# Patient Record
Sex: Male | Born: 1995 | Race: White | Hispanic: Yes | Marital: Single | Smoking: Never smoker
Health system: Southern US, Community
[De-identification: ages and names within clinical notes are randomized; demographics above are authoritative.]

## PROBLEM LIST (undated history)

## (undated) DIAGNOSIS — J45909 Unspecified asthma, uncomplicated: Secondary | ICD-10-CM

---
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
## 2017-03-18 ENCOUNTER — Telehealth: Payer: Self-pay | Admitting: Pediatrics

## 2017-03-18 ENCOUNTER — Encounter: Payer: Self-pay | Admitting: Pediatrics

## 2017-03-18 NOTE — Telephone Encounter (Signed)
Pt called stating that he is trying to join the [employer] and they asked him to please provide them with his last pe. ( 06/13/2016 ) Per Lars MageJuan, he is in need of this no later than Monday , 03/23/2017. I did advised the pt that it normally takes up to 7 business days. Please call him when ready.

## 2017-03-20 NOTE — Telephone Encounter (Signed)
Spoke with [place] regarding medical records. He only needs a copy of his last physical. He does not need any forms completed. Progress notes taken to front along with immunization records. Will more than likely need ROI completed.

## 2017-03-31 ENCOUNTER — Telehealth: Payer: Self-pay | Admitting: Pediatrics

## 2017-03-31 NOTE — Telephone Encounter (Signed)
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

## 2017-04-10 ENCOUNTER — Encounter: Payer: Self-pay | Admitting: Pediatrics

## 2017-04-10 ENCOUNTER — Other Ambulatory Visit: Payer: Self-pay | Admitting: Pediatrics

## 2017-04-10 DIAGNOSIS — R079 Chest pain, unspecified: Secondary | ICD-10-CM

## 2017-04-10 NOTE — Telephone Encounter (Addendum)
Letter done; copy for patient and faxing. Patient is aware.  Will also enter cardiology referral to trigger recall for the follow up.

## 2017-05-24 IMAGING — CT CT ABDOMEN W/ CM
2 of 4 series · 17 of 46 positions shown, 19 images · IV contrast (APPLIED)
Comparison: 07/31/2013.

CLINICAL DATA: Red area at in belly button for 2 weeks, pain.
Persistent umbilical sinus. Fever. Diarrhea.

EXAM:
CT ABDOMEN WITH CONTRAST
TECHNIQUE: Multidetector CT imaging of the abdomen was performed using the
standard protocol following bolus administration of intravenous
contrast.
CONTRAST:  125mL [id number] IOPAMIDOL ([id number]) INJECTION 61%

[Series 2: cor abd · coronal · 0.66mm/px · 3 of 92 slices shown]
[im 31/92  soft-tissue]
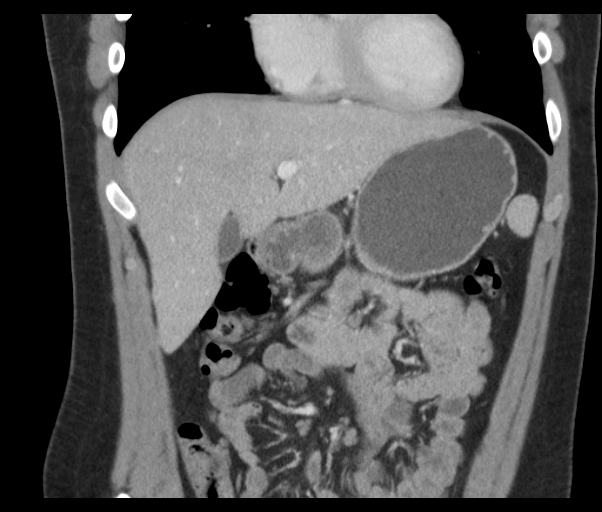
[im 41/92  soft-tissue]
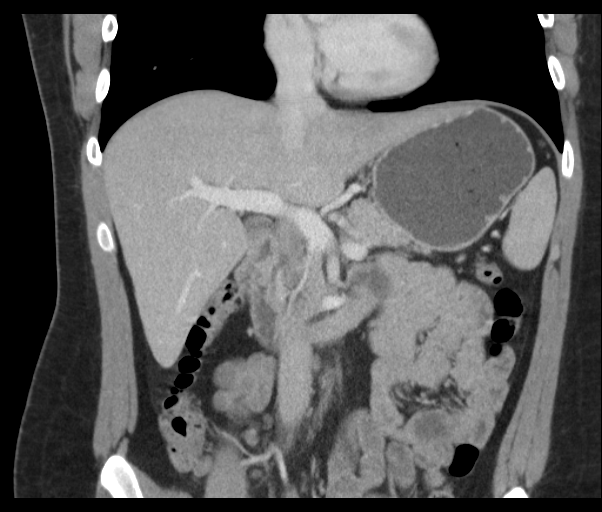
[im 51/92  soft-tissue]
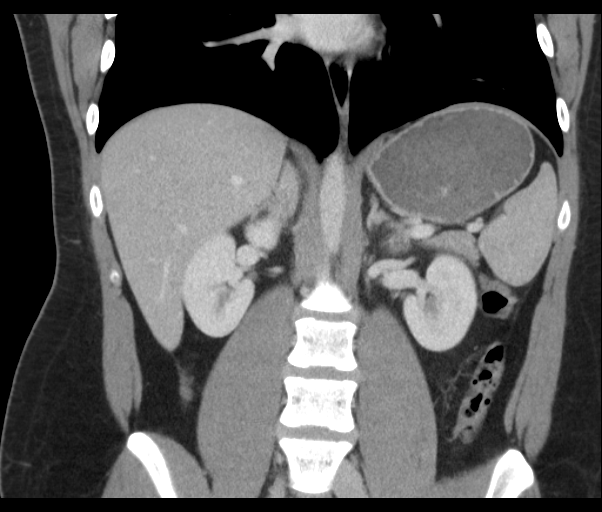

[Series 5: abdroutine 5.0 i31s 1 · axial · 0.79mm/px · z∈[-401,-116]mm · 14 of 64 slices shown, 16 images]
[im 4/64  soft-tissue]
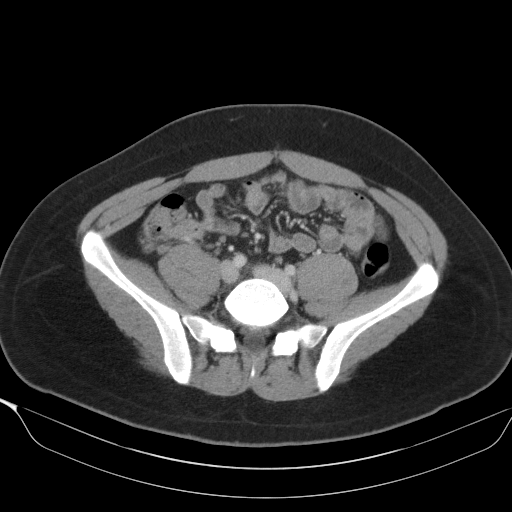
[im 4/64  bone]
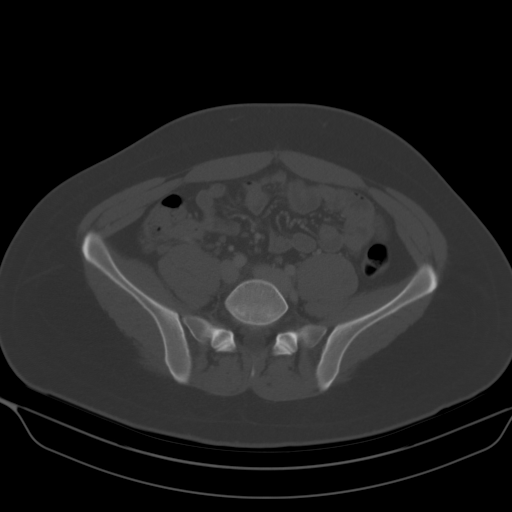
[im 10/64  soft-tissue]
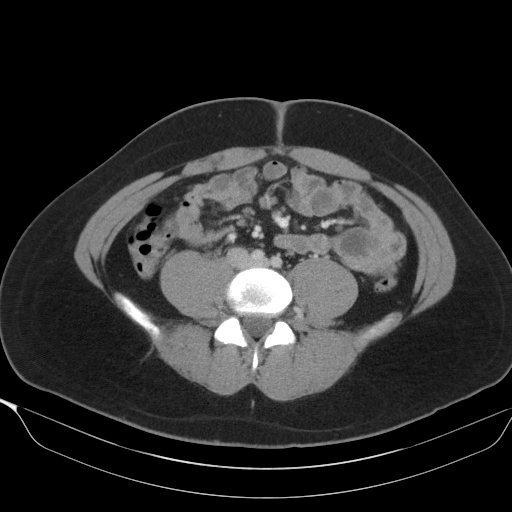
[im 13/64  soft-tissue]
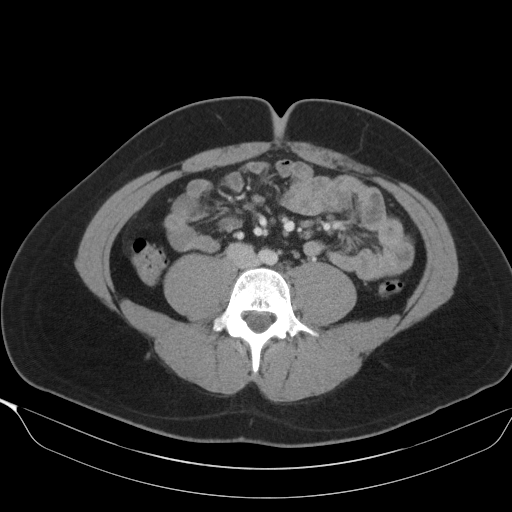
[im 19/64  soft-tissue]
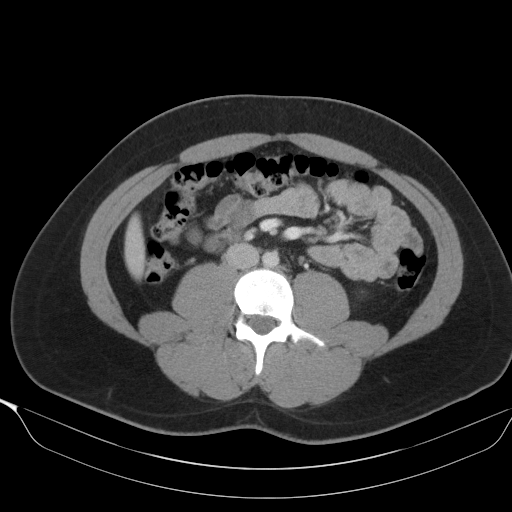
[im 22/64  soft-tissue]
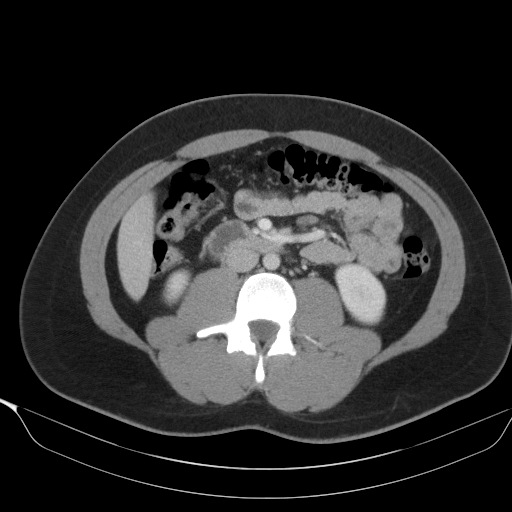
[im 25/64  soft-tissue]
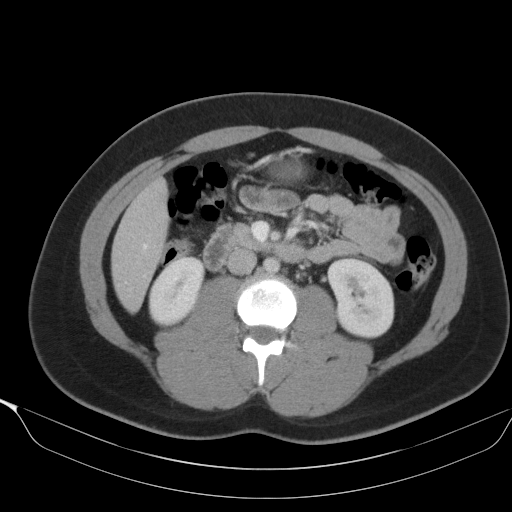
[im 31/64  soft-tissue]
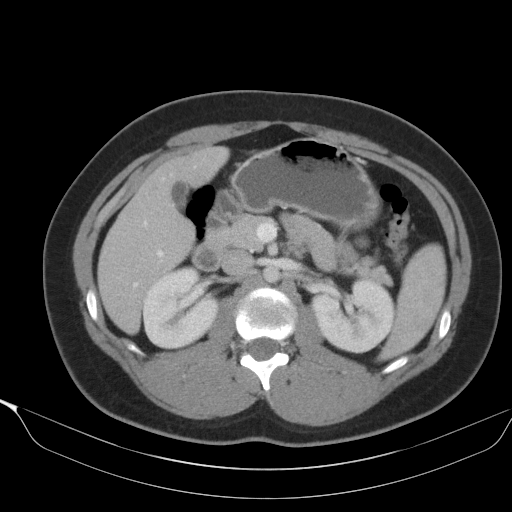
[im 34/64  soft-tissue]
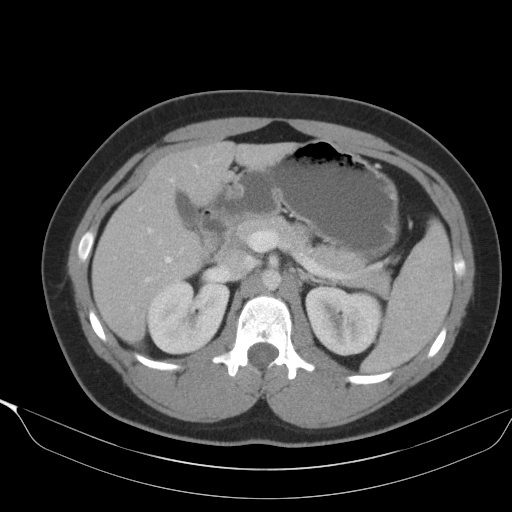
[im 40/64  soft-tissue]
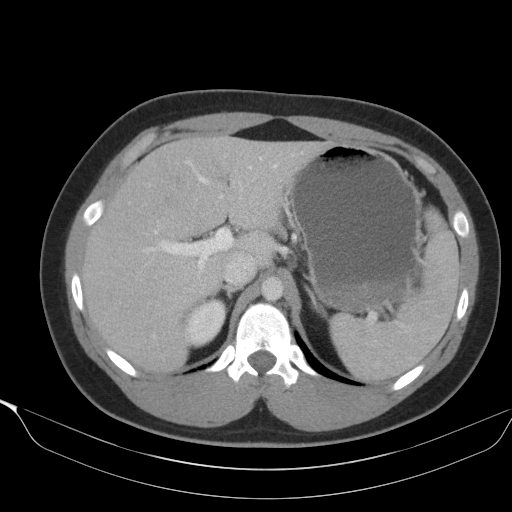
[im 40/64  bone]
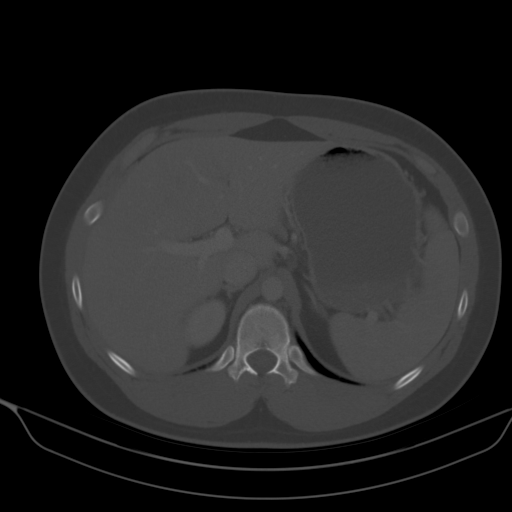
[im 43/64  soft-tissue]
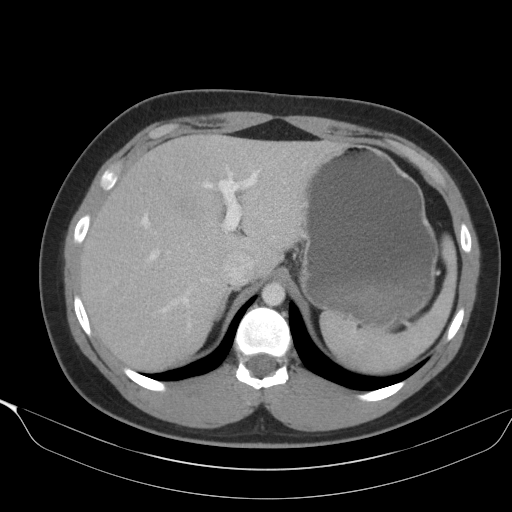
[im 49/64  soft-tissue]
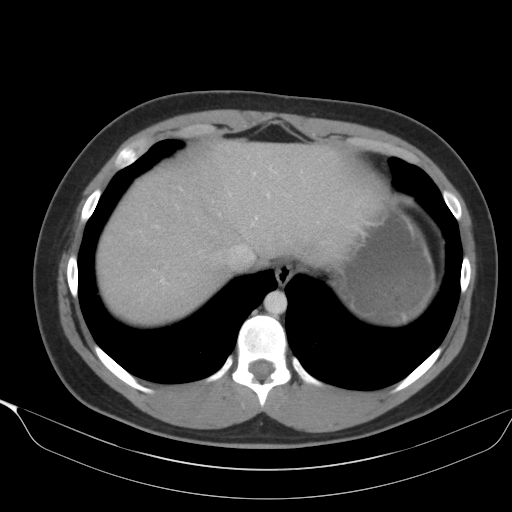
[im 52/64  soft-tissue]
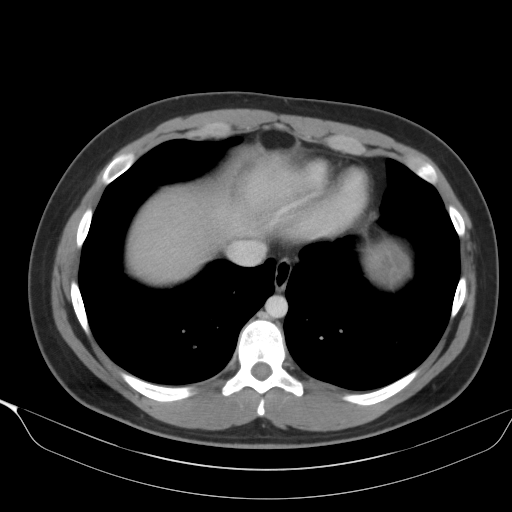
[im 55/64  soft-tissue]
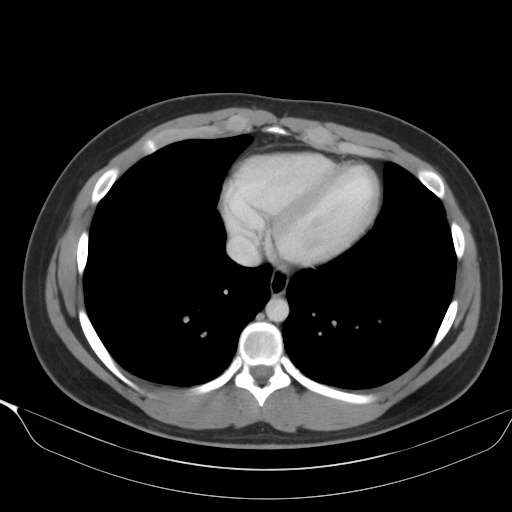
[im 61/64  soft-tissue]
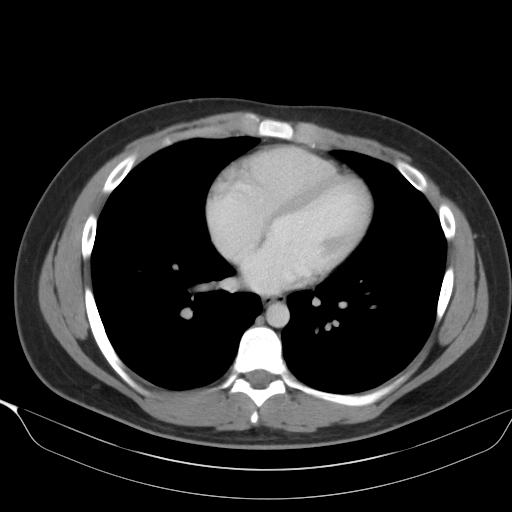

[17 of 46 positions shown; findings below may reference images not displayed]

FINDINGS: Lower chest: Lung bases show no acute findings. Heart size normal.
No pericardial or pleural effusion.

Hepatobiliary: Liver and gallbladder are unremarkable. No biliary
ductal dilatation.

Pancreas: Negative.

Spleen: Splenule, negative.

Adrenals/Urinary Tract: Adrenal glands and kidneys are unremarkable.
Visualized portions of the ureters are decompressed.

Stomach/Bowel: Stomach and visualized portions of the small bowel
and colon are unremarkable.

Vascular/Lymphatic: Vascular structures are unremarkable. No
pathologically enlarged lymph nodes. Small bowel mesenteric lymph
nodes measure up to 8 mm, similar to the prior exam.

Other: The umbilicus and periumbilical region are unremarkable. No
focal skin thickening, soft tissue tract, fluid collection or mass.

Musculoskeletal: No worrisome lytic or sclerotic lesions.
IMPRESSION: No findings to explain the patient's given symptoms.

## 2019-11-08 ENCOUNTER — Encounter (HOSPITAL_COMMUNITY): Payer: Self-pay | Admitting: Emergency Medicine

## 2019-11-08 ENCOUNTER — Other Ambulatory Visit: Payer: Self-pay

## 2019-11-08 ENCOUNTER — Emergency Department (HOSPITAL_COMMUNITY)
Admission: EM | Admit: 2019-11-08 | Discharge: 2019-11-09 | Disposition: A | Discharge status: Home / Self Care | Attending: Emergency Medicine | Admitting: Emergency Medicine

## 2019-11-08 DIAGNOSIS — R21 Rash and other nonspecific skin eruption: Secondary | ICD-10-CM | POA: Diagnosis not present

## 2019-11-08 NOTE — ED Triage Notes (Signed)
Pt presents with blistering rash to right side of his face. Pt states it showed up last week after he tried to pop a zit. Denies pain or itchiness.

## 2019-11-09 MED ORDER — CEPHALEXIN 500 MG PO CAPS: 500.0000 mg | ORAL_CAPSULE | Freq: Two times a day (BID) | ORAL | 0 refills | Status: AC

## 2019-11-09 NOTE — Discharge Instructions (Addendum)
Take the antibiotics as prescribed./If this area starts enlarging you need to seek reevaluation.

## 2019-11-09 NOTE — ED Provider Notes (Signed)
[facility] EMERGENCY DEPARTMENT Provider Note   CSN: [id number] Arrival date & time: 11/08/19  1641     History Chief Complaint  Patient presents with  . Rash    Tyler Proctor is a 24 y.o. male with past medical history who presents for evaluation of rash.  Patient states he has had a rash located to the right side of his face x1 week.  Occurred after he tried to "pop as it.".  Rashes nontender.  Has occasional itching.  Is not draining.  States he has a single solitary lesion to mid central chest.  This lesion is mildly tender.  No recent lotions or perfumes.  Rates his current pain a 0/10.  Is here, chills, nausea, vomiting, chest pain, shortness of breath, recent tick bites, recent insect bites, mucous membrane involvement, rash to palms and soles, urinary complaints.  Denies aggravating relieving factors.  Has been trying a Hispanic "ointment."   History obtained from patient and past medical records.  No interpreter is used.  HPI     Past Medical History:  Diagnosis Date  . Asthma    resolved on it's own  . Umbilical granuloma     Patient Active Problem List   Diagnosis Date Noted  . Chest pain 11/18/2016  . Cellulitis, umbilical 37/90/2409  . Abscess, umbilical 73/53/2992  . Hemoptysis 04/26/2015  . BMI (body mass index), pediatric, greater than or equal to 95% for age 41/21/2015    Past Surgical History:  Procedure Laterality Date  . EXCISION UMBILICAL NODULE N/A 09/19/8339   Procedure: EXCISION OF UMBILICAL GRANULOMA;  Surgeon: Coralie Keens, MD;  Location: [facility];  Service: General;  Laterality: N/A;  . LAPAROSCOPIC APPENDECTOMY N/A 07/31/2013   Procedure: APPENDECTOMY LAPAROSCOPIC;  Surgeon: Jerilynn Mages. Gerald Stabs, MD;  Location: [facility];  Service: Pediatrics;  Laterality: N/A;       Family History  Problem Relation Age of Onset  . Obesity Sister   . Diabetes Paternal Grandmother   . Hypertension Paternal  Grandmother     Social History   Tobacco Use  . Smoking status: Never Smoker  . Smokeless tobacco: Never Used  Substance Use Topics  . Alcohol use: No  . Drug use: No    Home Medications Prior to Admission medications   Medication Sig Start Date End Date Taking? Authorizing Provider  cephALEXin (KEFLEX) 500 MG capsule Take 1 capsule (500 mg total) by mouth 2 (two) times daily for 7 days. 11/09/19 11/16/19  Mollye Guinta A, PA-C    Allergies    Patient has no known allergies.  Review of Systems   Review of Systems  Constitutional: Negative.   HENT: Negative.   Respiratory: Negative.   Cardiovascular: Negative.   Gastrointestinal: Negative.   Genitourinary: Negative.   Musculoskeletal: Negative.   Skin: Positive for rash.  Neurological: Negative.   All other systems reviewed and are negative.   Physical Exam Updated Vital Signs BP 131/74   Pulse 92   Temp 99.1 F (37.3 C) (Oral)   Resp 18   SpO2 98%   Physical Exam Vitals and nursing note reviewed.  Constitutional:      General: He is not in acute distress.    Appearance: He is well-developed. He is not ill-appearing, toxic-appearing or diaphoretic.  HENT:     Head: Normocephalic and atraumatic.     Jaw: There is normal jaw occlusion.     Right Ear: Tympanic membrane, ear canal and external ear normal.  Left Ear: Tympanic membrane, ear canal and external ear normal.     Ears:     Comments: No lesions to ear canal    Nose: Nose normal.     Mouth/Throat:     Lips: Pink.     Mouth: Mucous membranes are moist.     Pharynx: Oropharynx is clear. Uvula midline.     Comments: No mucosal lesions, edema.  Mucous membranes moist. Eyes:     Pupils: Pupils are equal, round, and reactive to light.  Cardiovascular:     Rate and Rhythm: Normal rate and regular rhythm.  Pulmonary:     Effort: Pulmonary effort is normal. No respiratory distress.     Breath sounds: Normal breath sounds and air entry.     Comments:  Speaks in full sentences without difficulty Abdominal:     General: There is no distension.     Palpations: Abdomen is soft.  Musculoskeletal:        General: Normal range of motion.     Cervical back: Full passive range of motion without pain, normal range of motion and neck supple.     Comments: Moves in all 4 extremities without difficulty.  Compartments soft.  Tactile temperature to extremities. No bony tenderness.  Skin:    General: Skin is warm and dry.     Capillary Refill: Capillary refill takes less than 2 seconds.     Comments: Picture in chart.  Slightly vesicular in nature rash to right zygomatic and over right mandible and V2 distribution.  No rash to ears.  He also has single solitary lesion mid central chest.  Nontender palpation.  No bleeding or drainage.  No fluctuance or induration.  No bulla, target lesions, desquamated skin.  Neurological:     General: No focal deficit present.     Mental Status: He is alert.     Cranial Nerves: Cranial nerves are intact.     Sensory: Sensation is intact.     Motor: Motor function is intact.     Coordination: Coordination is intact.     Gait: Gait is intact.     Comments: Cn 2-12 intact without difficulty        ED Results / Procedures / Treatments   Labs (all labs ordered are listed, but only abnormal results are displayed) Labs Reviewed - No data to display  EKG None  Radiology No results found.  Procedures Procedures (including critical care time)  Medications Ordered in ED Medications - No data to display  ED Course  I have reviewed the triage vital signs and the nursing notes.  Pertinent labs & imaging results that were available during my care of the patient were reviewed by me and considered in my medical decision making (see chart for details).  24 year old presents for evaluation of rash to right side of face.  Located in V2-V3 distribution however does not involve ears.  Nontender palpation.  No active  bleeding or drainage.  No evidence of mucosal involvement.  No recent insect bites, change in lotion, perfumes or food intake.  No evidence of allergic reaction, anaphylaxis.  No evidence of abscess or cellulitis.  Question zoster however would be atypical for patient to have lesion on his chest as well.  Possibly more impetigo.  Will treat with antibiotics and have him follow-up in 2 days for reevaluation.  Patient denies any difficulty breathing or swallowing.  Pt has a patent airway without stridor and is handling secretions without difficulty; no angioedema. No  blisters, no warmth, no draining sinus tracts, no superficial abscesses, no bullous impetigo, no desquamation, no target lesions with dusky purpura or a central bulla. Not tender to touch. No concern for SJS, TEN, TSS, tick borne illness, syphilis or other life-threatening condition.   The patient has been appropriately medically screened and/or stabilized in the ED. I have low suspicion for any other emergent medical condition which would require further screening, evaluation or treatment in the ED or require inpatient management.  Patient is hemodynamically stable and in no acute distress.  Patient able to ambulate in department prior to ED.  Evaluation does not show acute pathology that would require ongoing or additional emergent interventions while in the emergency department or further inpatient treatment.  I have discussed the diagnosis with the patient and answered all questions.  Pain is been managed while in the emergency department and patient has no further complaints prior to discharge.  Patient is comfortable with plan discussed in room and is stable for discharge at this time.  I have discussed strict return precautions for returning to the emergency department.  Patient was encouraged to follow-up with PCP/specialist refer to at discharge.   Patient discussed with attending, Dr. Bebe Shaggy who agrees with the treatment, plan and  disposition   MDM Rules/Calculators/A&P                           Final Clinical Impression(s) / ED Diagnoses Final diagnoses:  Rash    Rx / DC Orders ED Discharge Orders         Ordered    cephALEXin (KEFLEX) 500 MG capsule  2 times daily     Discontinue  Reprint     11/09/19 0129           Crystallynn Noorani A, PA-C 11/09/19 0130    Zadie Rhine, MD 11/09/19 [phone]

## 2021-07-10 ENCOUNTER — Encounter (HOSPITAL_COMMUNITY): Payer: Self-pay | Admitting: Emergency Medicine

## 2021-07-10 ENCOUNTER — Ambulatory Visit (HOSPITAL_COMMUNITY)
Admission: EM | Admit: 2021-07-10 | Discharge: 2021-07-10 | Disposition: A | Discharge status: Home / Self Care | Payer: Medicaid Other | Attending: Physician Assistant | Admitting: Physician Assistant

## 2021-07-10 ENCOUNTER — Other Ambulatory Visit: Payer: Self-pay

## 2021-07-10 DIAGNOSIS — H61893 Other specified disorders of external ear, bilateral: Secondary | ICD-10-CM | POA: Diagnosis not present

## 2021-07-10 DIAGNOSIS — H6123 Impacted cerumen, bilateral: Secondary | ICD-10-CM | POA: Diagnosis not present

## 2021-07-10 MED ORDER — OFLOXACIN 0.3 % OT SOLN: 3.0000 [drp] | Freq: Two times a day (BID) | OTIC | 0 refills | Status: AC

## 2021-07-10 NOTE — ED Provider Notes (Signed)
[facility]    CSN: [id number] Arrival date & time: 07/10/21  1615      History   Chief Complaint Chief Complaint  Patient presents with   Otalgia    HPI Tyler Proctor is a 26 y.o. male.   Patient presents today with a 2-week history of bilateral ear fullness.  He denies any recent illness or URI symptoms including cough, congestion, sore throat, sneezing, chest pain, shortness of breath, nausea, vomiting.  Denies any recent swimming or airplane travel.  Denies any otorrhea or significant otalgia.  He has tried peroxide without improvement of symptoms.  He denies history of allergies.  He does report a history of cerumen impaction and reports current symptoms are similar to previous episodes of this condition.  He does not use Q-tips but occasionally uses earbuds/earplugs.   Past Medical History:  Diagnosis Date   Asthma    resolved on it's own   Umbilical granuloma     Patient Active Problem List   Diagnosis Date Noted   Chest pain 11/18/2016   Cellulitis, umbilical XX123456   Abscess, umbilical XX123456   Hemoptysis 04/26/2015   BMI (body mass index), pediatric, greater than or equal to 95% for age 43/21/2015    Past Surgical History:  Procedure Laterality Date   EXCISION UMBILICAL NODULE N/A [id number]   Procedure: EXCISION OF UMBILICAL GRANULOMA;  Surgeon: Coralie Keens, MD;  Location: [facility];  Service: General;  Laterality: N/A;   LAPAROSCOPIC APPENDECTOMY N/A 07/31/2013   Procedure: APPENDECTOMY LAPAROSCOPIC;  Surgeon: Jerilynn Mages. Gerald Stabs, MD;  Location: [facility];  Service: Pediatrics;  Laterality: N/A;       Home Medications    Prior to Admission medications   Medication Sig Start Date End Date Taking? Authorizing Provider  ofloxacin (FLOXIN) 0.3 % OTIC solution Place 3 drops into both ears 2 (two) times daily for 5 days. 07/10/21 07/15/21 Yes Monifah Freehling, Derry Skill, PA-C    Family History Family History  Problem Relation  Age of Onset   Healthy Mother    Healthy Father    Obesity Sister    Diabetes Paternal Grandmother    Hypertension Paternal Grandmother     Social History Social History   Tobacco Use   Smoking status: Never   Smokeless tobacco: Never  Substance Use Topics   Alcohol use: No   Drug use: No     Allergies   Patient has no known allergies.   Review of Systems Review of Systems  Constitutional:  Negative for activity change, appetite change, fatigue and fever.  HENT:  Negative for congestion, ear pain (fullness), sinus pressure, sneezing and sore throat.   Respiratory:  Negative for cough and shortness of breath.   Cardiovascular:  Negative for chest pain.  Neurological:  Negative for dizziness, light-headedness and headaches.    Physical Exam Triage Vital Signs ED Triage Vitals  Enc Vitals Group     BP 07/10/21 1731 (!) 142/81     Pulse Rate 07/10/21 1731 72     Resp 07/10/21 1731 16     Temp 07/10/21 1731 98.3 F (36.8 C)     Temp Source 07/10/21 1731 Oral     SpO2 07/10/21 1731 100 %     Weight 07/10/21 1728 236 lb 15.9 oz (107.5 kg)     Height 07/10/21 1728 5\' 11"  (1.803 m)     Head Circumference --      Peak Flow --      Pain Score  07/10/21 1728 2     Pain Loc --      Pain Edu? --      Excl. in [place]? --    No data found.  Updated Vital Signs BP (!) 142/81 (BP Location: Right Arm)    Pulse 72    Temp 98.3 F (36.8 C) (Oral)    Resp 16    Ht 5\' 11"  (1.803 m)    Wt 236 lb 15.9 oz (107.5 kg)    SpO2 100%    BMI 33.05 kg/m   Visual Acuity Right Eye Distance:   Left Eye Distance:   Bilateral Distance:    Right Eye Near:   Left Eye Near:    Bilateral Near:     Physical Exam Vitals reviewed.  Constitutional:      General: He is awake.     Appearance: Normal appearance. He is well-developed. He is not ill-appearing.     Comments: Very pleasant male appears stated age in no acute distress sitting comfortably in exam room  HENT:     Head: Normocephalic  and atraumatic.     Right Ear: Tympanic membrane, ear canal and external ear normal. There is impacted cerumen.     Left Ear: Tympanic membrane, ear canal and external ear normal. There is impacted cerumen.     Ears:     Comments: Cerumen impaction noted bilaterally; cerumen impaction resolved on left revealing erythematous ear canal.  Cerumen impaction partially resolved on right; able to visualize approximately 40% of TM that appears normal with erythematous ear canal.    Nose: Nose normal.     Mouth/Throat:     Pharynx: Uvula midline. Posterior oropharyngeal erythema present. No oropharyngeal exudate or uvula swelling.  Cardiovascular:     Rate and Rhythm: Normal rate and regular rhythm.     Heart sounds: Normal heart sounds, S1 normal and S2 normal. No murmur heard. Pulmonary:     Effort: Pulmonary effort is normal. No accessory muscle usage or respiratory distress.     Breath sounds: Normal breath sounds. No stridor. No wheezing, rhonchi or rales.     Comments: Clear to auscultation bilaterally Abdominal:     General: Bowel sounds are normal.     Palpations: Abdomen is soft.     Tenderness: There is no abdominal tenderness.  Neurological:     Mental Status: He is alert.  Psychiatric:        Behavior: Behavior is cooperative.     UC Treatments / Results  Labs (all labs ordered are listed, but only abnormal results are displayed) Labs Reviewed - No data to display  EKG   Radiology No results found.  Procedures Procedures (including critical care time)  Medications Ordered in UC Medications - No data to display  Initial Impression / Assessment and Plan / UC Course  I have reviewed the triage vital signs and the nursing notes.  Pertinent labs & imaging results that were available during my care of the patient were reviewed by me and considered in my medical decision making (see chart for details).     Symptoms significantly improved within office irrigation.  After  irrigation patient had erythema and tenderness of external ear canals so will cover with ofloxacin.  Recommended he avoid placing anything in ear and avoid submerging head in water until symptoms improve.  Can use over-the-counter analgesics for pain relief.  Discussed that if symptoms persist or worsen he should follow-up with ENT and was given contact information for local  provider.  Discussed alarm symptoms that warrant emergent evaluation including severe pain, fever, nausea, vomiting, diarrhea, difficulty hearing.  Strict return precautions given to which he expressed understanding.  Final Clinical Impressions(s) / UC Diagnoses   Final diagnoses:  Bilateral impacted cerumen  Irritation of external ear canal, bilateral     Discharge Instructions      We were able to get a lot of the wax out.  Please use ofloxacin drops twice daily for 5 days in both ears to cover for external ear infection related to the irritation and redness from cleaning your ears.  Use Tylenol and ibuprofen for pain.  If your symptoms or not improving or you continue to have a sensation of fullness/decreased hearing please follow-up with ENT.  If you develop any severe pain, fever, drainage from the ear, nausea/vomiting you should be seen immediately.     ED Prescriptions     Medication Sig Dispense Auth. Provider   ofloxacin (FLOXIN) 0.3 % OTIC solution Place 3 drops into both ears 2 (two) times daily for 5 days. 5 mL Alene Bergerson K, PA-C      PDMP not reviewed this encounter.   Terrilee Croak, PA-C 07/10/21 1854

## 2021-07-10 NOTE — Discharge Instructions (Signed)
We were able to get a lot of the wax out.  Please use ofloxacin drops twice daily for 5 days in both ears to cover for external ear infection related to the irritation and redness from cleaning your ears.  Use Tylenol and ibuprofen for pain.  If your symptoms or not improving or you continue to have a sensation of fullness/decreased hearing please follow-up with ENT.  If you develop any severe pain, fever, drainage from the ear, nausea/vomiting you should be seen immediately.

## 2021-07-10 NOTE — ED Triage Notes (Signed)
Pt reports bilateral ear pain x 2 weeks.

## 2021-10-30 ENCOUNTER — Encounter (HOSPITAL_COMMUNITY): Payer: Self-pay | Admitting: Emergency Medicine

## 2021-10-30 ENCOUNTER — Ambulatory Visit (HOSPITAL_COMMUNITY)
Admission: EM | Admit: 2021-10-30 | Discharge: 2021-10-30 | Disposition: A | Discharge status: Home / Self Care | Payer: Medicaid Other | Attending: Emergency Medicine | Admitting: Emergency Medicine

## 2021-10-30 DIAGNOSIS — H6122 Impacted cerumen, left ear: Secondary | ICD-10-CM

## 2021-10-30 DIAGNOSIS — H6123 Impacted cerumen, bilateral: Secondary | ICD-10-CM | POA: Diagnosis not present

## 2021-10-30 MED ORDER — DEBROX 6.5 % OT SOLN: 2.0000 [drp] | OTIC | 0 refills | Status: AC

## 2021-10-30 NOTE — ED Triage Notes (Signed)
Pt is present today with left ear fullness and pain. Pt sx started Saturday

## 2021-10-30 NOTE — ED Provider Notes (Signed)
[facility]    CSN: [id number] Arrival date & time: 10/30/21  1555     History   Chief Complaint Chief Complaint  Patient presents with   Otalgia    HPI Tyler Proctor Register is a 26 y.o. male.  Presents with left ear pain and fullness x 4 days.  Muffled hearing. Reports pain began Saturday. Sunday he went swimming and got water in his ear which made it worse.  He tried hydrogen peroxide but this did not help.  He does have history of lots of wax in the ears. No trauma to the ear canal - does not use Q-tips. No diving, flying. Denies fever, chills, drainage from the ear, congestion, cough, sore throat, abdominal pain, vomiting/diarrhea.  Past Medical History:  Diagnosis Date   Asthma    resolved on it's own   Umbilical granuloma     Patient Active Problem List   Diagnosis Date Noted   Chest pain 11/18/2016   Cellulitis, umbilical XX123456   Abscess, umbilical XX123456   Hemoptysis 04/26/2015   BMI (body mass index), pediatric, greater than or equal to 95% for age 53/21/2015    Past Surgical History:  Procedure Laterality Date   EXCISION UMBILICAL NODULE N/A [id number]   Procedure: EXCISION OF UMBILICAL GRANULOMA;  Surgeon: Coralie Keens, MD;  Location: [facility];  Service: General;  Laterality: N/A;   LAPAROSCOPIC APPENDECTOMY N/A 07/31/2013   Procedure: APPENDECTOMY LAPAROSCOPIC;  Surgeon: Jerilynn Mages. Gerald Stabs, MD;  Location: [facility];  Service: Pediatrics;  Laterality: N/A;     Home Medications    Prior to Admission medications   Medication Sig Start Date End Date Taking? Authorizing Provider  carbamide peroxide (DEBROX) 6.5 % OTIC solution Place 2 drops into both ears 2 (two) times a week. 10/31/21  Yes Paulene Tayag, Wells Guiles, PA-C    Family History Family History  Problem Relation Age of Onset   Healthy Mother    Healthy Father    Obesity Sister    Diabetes Paternal Grandmother    Hypertension Paternal Grandmother     Social  History Social History   Tobacco Use   Smoking status: Never   Smokeless tobacco: Never  Substance Use Topics   Alcohol use: No   Drug use: No     Allergies   Patient has no known allergies.   Review of Systems Review of Systems  HENT:  Positive for ear pain.   Per HPI  Physical Exam Triage Vital Signs ED Triage Vitals  Enc Vitals Group     BP 10/30/21 1648 (!) 145/93     Pulse Rate 10/30/21 1647 62     Resp 10/30/21 1647 18     Temp 10/30/21 1647 98.3 F (36.8 C)     Temp src --      SpO2 10/30/21 1647 100 %     Weight --      Height --      Head Circumference --      Peak Flow --      Pain Score 10/30/21 1647 1     Pain Loc --      Pain Edu? --      Excl. in [place]? --    No data found.  Updated Vital Signs BP (!) 145/93   Pulse 62   Temp 98.3 F (36.8 C)   Resp 18   SpO2 100%    Physical Exam Vitals and nursing note reviewed.  Constitutional:      General:  He is not in acute distress. HENT:     Right Ear: Tympanic membrane and external ear normal.     Left Ear: External ear normal. There is impacted cerumen.     Nose: Nose normal. No congestion.     Mouth/Throat:     Mouth: Mucous membranes are moist.     Pharynx: Oropharynx is clear. No posterior oropharyngeal erythema.  Eyes:     Conjunctiva/sclera: Conjunctivae normal.  Cardiovascular:     Rate and Rhythm: Normal rate and regular rhythm.     Heart sounds: Normal heart sounds.  Pulmonary:     Effort: Pulmonary effort is normal.     Breath sounds: Normal breath sounds.  Lymphadenopathy:     Cervical: No cervical adenopathy.  Neurological:     Mental Status: He is alert and oriented to person, place, and time.    UC Treatments / Results  Labs (all labs ordered are listed, but only abnormal results are displayed) Labs Reviewed - No data to display  EKG  Radiology No results found.  Procedures Procedures   Medications Ordered in UC Medications - No data to display  Initial  Impression / Assessment and Plan / UC Course  I have reviewed the triage vital signs and the nursing notes.  Pertinent labs & imaging results that were available during my care of the patient were reviewed by me and considered in my medical decision making (see chart for details).  On exam left canal and TM are blocked by impacted wax. Right ear moderate wax but TM visualized. Bilateral ears rinsed out. Re-examination reveals clear canal and TM intact on left. Patient feels hearing improved and pain/pressure are relieved. Will send debrox drops to use once or twice a week to prevent buildup of wax. Return precautions discussed. Patient agrees to plan and is discharged in stable condition.  Final Clinical Impressions(s) / UC Diagnoses   Final diagnoses:  Impacted cerumen of left ear     Discharge Instructions      You can use the eardrops once or twice a week to prevent wax buildup.   If your symptoms return please return to the urgent care or follow-up with your primary care. If your symptoms worsen please go to the emergency department.     ED Prescriptions     Medication Sig Dispense Auth. Provider   carbamide peroxide (DEBROX) 6.5 % OTIC solution Place 2 drops into both ears 2 (two) times a week. 15 mL Ysela Hettinger, Wells Guiles, PA-C      PDMP not reviewed this encounter.   Carrera Kiesel, Wells Guiles, [place] 10/30/21 1747

## 2021-10-30 NOTE — Discharge Instructions (Addendum)
You can use the eardrops once or twice a week to prevent wax buildup.   If your symptoms return please return to the urgent care or follow-up with your primary care. If your symptoms worsen please go to the emergency department.

## 2022-09-25 ENCOUNTER — Encounter: Payer: No Typology Code available for payment source | Attending: Hospitalist | Admitting: Dietician

## 2022-09-25 ENCOUNTER — Encounter: Payer: Self-pay | Admitting: Dietician

## 2022-09-25 VITALS — Ht 72.0 in | Wt 288.2 lb

## 2022-09-25 DIAGNOSIS — E669 Obesity, unspecified: Secondary | ICD-10-CM | POA: Diagnosis present

## 2022-09-25 NOTE — Progress Notes (Addendum)
Medical Nutrition Therapy  Appointment Start time:  1:55  Appointment End time:  3:11  Primary concerns today: weight loss  Referral diagnosis: Obesity Preferred learning style: no preference indicated (auditory, visual, hands on, no preference indicated) Learning readiness: contemplative (not ready, contemplating, ready, change in progress)  NUTRITION ASSESSMENT   Anthropometrics  Start weight at [facility]:  (09/25/2022) Height: 72 in BMI: 39.09 kg/m   Clinical Medical Hx: bilateral knee pain,  r/ meniscal tear, asthma, obesity Medications: ibuprofen prescribed (pt states he is not taking it) Labs: Vit D 18.03 Notable Signs/Symptoms: none noted  Body Composition Scale 09/25/2022  Current Body Weight 284.1  Total Body Fat % 32.5  Visceral Fat 20  Fat-Free Mass % 67.4   Total Body Water % 48.4  Muscle-Mass lbs 53.5  BMI 38.6  Body Fat Displacement          Torso  lbs 57.3         Left Leg  lbs 11.4         Right Leg  lbs 11.4         Left Arm  lbs 5.7         Right Arm  lbs 5.7   Lifestyle & Dietary Hx  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 Pt states he works for a [employer]. Pt states he feels best around 210 and not any lower. Pt states he has been doing intermittent fasting for awhile, stating he has been eating only one meal for the past 2 weeks. Pt hesitant to let go of intermittent fasting. Pt states he has cut back a lot on sweets and ice cream, stating he will eat a "blow pop" Pt states he can not get rid of sweet tea.  Estimated daily fluid intake: 64-128 oz Supplements: none Sleep: 6-7 hours a night; good with AC on  Stress / self-care: gym daily Current average weekly physical activity: 45-60 min of cardio (elliptical is best for his knees); 30 minutes of resistance training.  24-Hr Dietary Recall First Meal: up at 6 am skip Snack:  Second Meal: monster energy drink (zero sugar) Snack:  Third Meal: 3 pm gets off  work and eats dinner: beef or chicken, vegetables, slice of bread, sweet tea Snack:  Beverages: sweet tea, milk  Estimated Energy Needs Calories: 1800  NUTRITION DIAGNOSIS  NB-1.5 Disordered eating pattern As related to intermittent fasting.  As evidenced by eating one meal a day.   NUTRITION INTERVENTION  Nutrition education (E-1) on the following topics:  Encouraged patient to honor their body's internal hunger and fullness cues.  Throughout the day, check in mentally and rate hunger. Stop eating when satisfied not full regardless of how much food is left on the plate.  Get more if still hungry 20-30 minutes later.  The key is to honor satisfaction so throughout the meal, rate fullness factor and stop when comfortably satisfied not physically full. The key is to honor hunger and fullness without any feelings of guilt or shame.  Pay attention to what the internal cues are, rather than any external factors. This will enhance the confidence you have in listening to your own body and following those internal cues enabling you to increase how often you eat when you are hungry not out of appetite and stop when you are satisfied not full.  Encouraged pt to continue to eat balanced meals inclusive of non starchy vegetables 2 times a day  7 days a week Encouraged pt to choose lean protein sources: limiting beef, pork, sausage, hotdogs, and lunch meat Encourage pt to choose healthy fats such as plant based limiting animal fats Encouraged pt to continue to drink a minium 64 fluid ounces with half being plain water to satisfy proper hydration   Health Benefits of Physical Activity Why you need complex carbohydrates: Whole grains and other complex carbohydrates are required to have a healthy diet. Whole grains provide fiber which can help with blood glucose levels and help keep you satiated. Fruits and starchy vegetables provide essential vitamins and minerals required for immune function, eyesight support,  brain support, bone density, wound healing and many other functions within the body. According to the current evidenced based 2020-2025 Dietary Guidelines for Americans, complex carbohydrates are part of a healthy eating pattern which is associated with a decreased risk for type 2 diabetes, cancers, and cardiovascular disease.   Immediate Benefits for Adults A single bout of moderate-to vigorous physical activity provides immediate benefits for your health. Sleep - Improves sleep quality Less Anxiety - Reduces feelings of anxiety Blood Pressure - Reduces blood pressure   Long-term Benefits for Adults Regular physical activity provides important health benefits for chronic disease prevention. Brain Health - Reduces risks of developing dementia (including Alzheimer's disease) and reduces risk of depression Heart Health - Lowers risk of heart disease, stroke, and type 2 diabetes Cancer Prevention -Lowers risk of eight cancers: bladder, breast, colon, endometrium, esophagus, kidney, lung, and stomach Healthy Weight - Reduces risk of weight gain Bone Strength - Improves bone health Balance and Coordination - Reduces risks of falls  Handouts Provided Include  Body Comp Scale readout [employer] Health Benefits of Physical Activity  Learning Style & Readiness for Change Teaching method utilized: Visual & Auditory  Demonstrated degree of understanding via: Teach Back  Barriers to learning/adherence to lifestyle change: habit of intermittent fasting  Goals Established by Pt Avoid intermittent fasting; prepare snacks for work day; include protein, vegetables and complex carbohydrate Continue physical activity Slow down and aim for feeling of satisfaction instead of fullness Avoid sugar sweetened beverages  MONITORING & EVALUATION Dietary intake, weekly physical activity.  Next Steps  Patient is to return for follow-up in 2 months.

## 2022-12-01 ENCOUNTER — Ambulatory Visit: Payer: No Typology Code available for payment source | Admitting: Dietician

## 2022-12-09 ENCOUNTER — Encounter: Payer: Self-pay | Admitting: Dietician

## 2022-12-09 ENCOUNTER — Encounter: Payer: No Typology Code available for payment source | Attending: Hospitalist | Admitting: Dietician

## 2022-12-09 VITALS — Ht 72.0 in | Wt 288.3 lb

## 2022-12-09 DIAGNOSIS — E669 Obesity, unspecified: Secondary | ICD-10-CM | POA: Insufficient documentation

## 2022-12-09 NOTE — Progress Notes (Signed)
Medical Nutrition Therapy Follow-up  Appointment Start time:  2:04  Appointment End time:  3:49  Primary concerns today: weight loss  Referral diagnosis: Obesity Preferred learning style: no preference indicated (auditory, visual, hands on, no preference indicated) Learning readiness: contemplative (not ready, contemplating, ready, change in progress)  NUTRITION ASSESSMENT   Anthropometrics  Start weight at [facility]:  (09/25/2022) Height: 72 in Weight today:   Clinical Medical Hx: bilateral knee pain,  r/ meniscal tear, asthma, obesity Medications: ibuprofen prescribed (pt states he is not taking it) Labs: Vit D 18.03 Notable Signs/Symptoms: none noted  Body Composition Scale 09/25/2022 12/09/2022  Current Body Weight 284.1 288.3  Total Body Fat % 32.5 33.0  Visceral Fat 20 20  Fat-Free Mass % 67.4 66.9   Total Body Water % 48.4 47.9  Muscle-Mass lbs 53.5 54.4  BMI 38.6 39.2  Body Fat Displacement           Torso  lbs 57.3 59.1         Left Leg  lbs 11.4 11.8         Right Leg  lbs 11.4 11.8         Left Arm  lbs 5.7 5.9         Right Arm  lbs 5.7 5.9   Lifestyle & Dietary Hx  Pt states he has been serving himself instead of his mother serving him stating he can watch his portion size better. Pt states he is reading food labels. Pt states he has stopped drinking the monster drinks, stating he is drinking more water. Pt states she has increased time at the gym, stating he is now doing 60 minutes of cardio and 60 minutes of resistance/weights. Pt states he is not intermittent fasting anymore. Pt's pattern before dinner is only granola bars. Dietitian advised that he aim for half his plate being fruits and vegetables at breakfast and lunch.  Estimated daily fluid intake: 64-128 oz Supplements: none Sleep: 6-7 hours a night; good with AC on  Stress / self-care: gym daily Current average weekly physical activity: 60 min of cardio (elliptical is best for his knees); 60 minutes  of resistance training (mostly legs).  24-Hr Dietary Recall First Meal: up at 6 am  Snack: granola bars, nature valley or cliff bars Second Meal: granola bars, nature valley or cliff bars Snack: granola bars, nature valley or cliff bars Third Meal: 3 pm gets off work and eats dinner: beef or chicken, beans and tortilla, vegetables, slice of bread Snack:  Beverages: water, zero sugar Gatorade, zero sugar coke, milk  Estimated Energy Needs Calories: 1800  NUTRITION DIAGNOSIS  NB-1.5 Disordered eating pattern As related to intermittent fasting.  As evidenced by eating one meal a day.  NUTRITION INTERVENTION  Nutrition education (E-1) on the following topics:  Encouraged patient to honor their body's internal hunger and fullness cues.  Throughout the day, check in mentally and rate hunger. Stop eating when satisfied not full regardless of how much food is left on the plate.  Get more if still hungry 20-30 minutes later.  The key is to honor satisfaction so throughout the meal, rate fullness factor and stop when comfortably satisfied not physically full. The key is to honor hunger and fullness without any feelings of guilt or shame.  Pay attention to what the internal cues are, rather than any external factors. This will enhance the confidence you have in listening to your own body and following those internal cues enabling you to increase  how often you eat when you are hungry not out of appetite and stop when you are satisfied not full.  Encouraged pt to continue to eat balanced meals inclusive of non starchy vegetables 2 times a day 7 days a week Encouraged pt to choose lean protein sources: limiting beef, pork, sausage, hotdogs, and lunch meat Encourage pt to choose healthy fats such as plant based limiting animal fats Encouraged pt to continue to drink a minium 64 fluid ounces with half being plain water to satisfy proper hydration   Health Benefits of Physical Activity Why you need complex  carbohydrates: Whole grains and other complex carbohydrates are required to have a healthy diet. Whole grains provide fiber which can help with blood glucose levels and help keep you satiated. Fruits and starchy vegetables provide essential vitamins and minerals required for immune function, eyesight support, brain support, bone density, wound healing and many other functions within the body. According to the current evidenced based 2020-2025 Dietary Guidelines for Americans, complex carbohydrates are part of a healthy eating pattern which is associated with a decreased risk for type 2 diabetes, cancers, and cardiovascular disease.  Pt created meal ideas with meal ideas handout during the appointment, to include more vegetables and fruits.  Handouts Provided Include  Body Comp Scale readout Meal Ideas  Learning Style & Readiness for Change Teaching method utilized: Visual & Auditory  Demonstrated degree of understanding via: Teach Back  Barriers to learning/adherence to lifestyle change: habit of intermittent fasting  Goals Established by Pt Continue: Avoid intermittent fasting; prepare snacks for work day; include protein, vegetables and complex carbohydrate Continue: physical activity Continue: Slow down and aim for feeling of satisfaction instead of fullness Continue: Avoid sugar sweetened beverages New: aim for 2 or more serving of non-starchy vegetables a day New: aim for complex carbohydrates from fruit, grains and starchy vegetables.  MONITORING & EVALUATION Dietary intake, weekly physical activity.  Next Steps  Patient is to return for follow-up in 2 months.

## 2023-02-10 ENCOUNTER — Ambulatory Visit: Payer: No Typology Code available for payment source | Admitting: Dietician

## 2023-02-23 ENCOUNTER — Encounter: Payer: Self-pay | Admitting: Dietician

## 2023-02-23 ENCOUNTER — Encounter: Payer: No Typology Code available for payment source | Attending: Hospitalist | Admitting: Dietician

## 2023-02-23 VITALS — Ht 72.0 in | Wt 292.7 lb

## 2023-02-23 DIAGNOSIS — E669 Obesity, unspecified: Secondary | ICD-10-CM | POA: Diagnosis present

## 2023-02-23 DIAGNOSIS — Z713 Dietary counseling and surveillance: Secondary | ICD-10-CM | POA: Insufficient documentation

## 2023-02-23 DIAGNOSIS — Z6839 Body mass index (BMI) 39.0-39.9, adult: Secondary | ICD-10-CM | POA: Diagnosis not present

## 2023-02-23 NOTE — Progress Notes (Signed)
Medical Nutrition Therapy Follow-up  Appointment Start time:  3:18  Appointment End time:  3:59  Primary concerns today: weight loss  Referral diagnosis: Obesity Preferred learning style: no preference indicated (auditory, visual, hands on, no preference indicated) Learning readiness: contemplative (not ready, contemplating, ready, change in progress)  NUTRITION ASSESSMENT   Anthropometrics  Start weight at [facility]:  (09/25/2022) Height: 72 in Weight today: 292.7 lbs  Clinical Medical Hx: bilateral knee pain,  r/ meniscal tear, asthma, obesity Medications: ibuprofen prescribed (pt states he is not taking it) Labs: Vit D 18.03 Notable Signs/Symptoms: none noted  Body Composition Scale 09/25/2022 12/09/2022 02/23/2023  Current Body Weight 284.1 288.3 292.7  Total Body Fat % 32.5 33.0 33.5  Visceral Fat 20 20 21   Fat-Free Mass % 67.4 66.9 66.4   Total Body Water % 48.4 47.9 47.4  Muscle-Mass lbs 53.5 54.4 55.2  BMI 38.6 39.2 39.8  Body Fat Displacement            Torso  lbs 57.3 59.1 60.9         Left Leg  lbs 11.4 11.8 12.1         Right Leg  lbs 11.4 11.8 12.1         Left Arm  lbs 5.7 5.9 6.0         Right Arm  lbs 5.7 5.9 6.0   Lifestyle & Dietary Hx  Pt states he goes to physical therapy once a month. Pt states he was told by physical therapist to only do 15 minutes of cardio. Pt states he has been eating at least 2 servings of non-starchy vegetables a day. Pt states he has been aiming for satisfaction, stating he feels satisfied with eating less now. Pt states he starts with smaller portions. Pt states he does not eat breakfast, stating he goes straight to school. Pt states he is doing well controlling what he eats when he is at home. Pt states he is happy with where he is now, stating he is eating more healthy. Pt states he is okay at this weight, but not seeing the results of his efforts.  Estimated daily fluid intake: 64-128 oz Supplements: none Sleep: 6-7 hours a  night; good with AC on  Stress / self-care: gym daily Current average weekly physical activity: gym (about 6:00 pm)15 min of cardio (elliptical is best for his knees); 30 minutes of resistance training (mostly legs), daily (every other day if he has a lot of homework)  24-Hr Dietary Recall First Meal: up at 8 am skip, water Snack:  Second Meal: 1 pm, rice, broccoli, protein (meat), eggs Snack: greek yogurt, nuts, oatmeal pack Third Meal: 3 pm gets off work and eats dinner: beef or chicken, beans and tortilla, vegetables, slice of bread Snack: nuts Beverages: water, zero sugar Gatorade, zero sugar coke, milk (one cup a day)  Estimated Energy Needs Calories: 1800  NUTRITION DIAGNOSIS  NB-1.5 Disordered eating pattern As related to intermittent fasting.  As evidenced by eating one meal a day.  NUTRITION INTERVENTION  Nutrition education (E-1) on the following topics:  Encouraged patient to honor their body's internal hunger and fullness cues.  Throughout the day, check in mentally and rate hunger. Stop eating when satisfied not full regardless of how much food is left on the plate.  Get more if still hungry 20-30 minutes later.  The key is to honor satisfaction so throughout the meal, rate fullness factor and stop when comfortably satisfied not physically full. The  key is to honor hunger and fullness without any feelings of guilt or shame.  Pay attention to what the internal cues are, rather than any external factors. This will enhance the confidence you have in listening to your own body and following those internal cues enabling you to increase how often you eat when you are hungry not out of appetite and stop when you are satisfied not full.  Encouraged pt to continue to eat balanced meals inclusive of non starchy vegetables 2 times a day 7 days a week Encouraged pt to choose lean protein sources: limiting beef, pork, sausage, hotdogs, and lunch meat Encourage pt to choose healthy fats such  as plant based limiting animal fats Encouraged pt to continue to drink a minium 64 fluid ounces with half being plain water to satisfy proper hydration   Health Benefits of Physical Activity Why you need complex carbohydrates: Whole grains and other complex carbohydrates are required to have a healthy diet. Whole grains provide fiber which can help with blood glucose levels and help keep you satiated. Fruits and starchy vegetables provide essential vitamins and minerals required for immune function, eyesight support, brain support, bone density, wound healing and many other functions within the body. According to the current evidenced based 2020-2025 Dietary Guidelines for Americans, complex carbohydrates are part of a healthy eating pattern which is associated with a decreased risk for type 2 diabetes, cancers, and cardiovascular disease.  Pt created meal ideas with meal ideas handout during the appointment, to include more vegetables and fruits.  Handouts Provided Include  Body Comp Scale readout Meal Ideas  Learning Style & Readiness for Change Teaching method utilized: Visual & Auditory  Demonstrated degree of understanding via: Teach Back  Barriers to learning/adherence to lifestyle change: habit of intermittent fasting  Goals Established by Pt Re-engage: Avoid intermittent fasting; prepare snacks for work day; include protein, vegetables and complex carbohydrate Continue: physical activity Continue: Slow down and aim for feeling of satisfaction instead of fullness Continue: Avoid sugar sweetened beverages Continue: aim for 2 or more serving of non-starchy vegetables a day Continue: aim for complex carbohydrates from fruit, grains and starchy vegetables.  MONITORING & EVALUATION Dietary intake, weekly physical activity.  Next Steps  Patient is to return for follow-up in 2 months.

## 2023-04-16 ENCOUNTER — Ambulatory Visit: Payer: No Typology Code available for payment source | Admitting: Dietician

## 2023-06-01 ENCOUNTER — Other Ambulatory Visit (INDEPENDENT_AMBULATORY_CARE_PROVIDER_SITE_OTHER): Payer: No Typology Code available for payment source

## 2023-06-01 ENCOUNTER — Ambulatory Visit (INDEPENDENT_AMBULATORY_CARE_PROVIDER_SITE_OTHER): Payer: No Typology Code available for payment source | Admitting: Orthopedic Surgery

## 2023-06-01 DIAGNOSIS — M25561 Pain in right knee: Secondary | ICD-10-CM

## 2023-06-01 DIAGNOSIS — M25562 Pain in left knee: Secondary | ICD-10-CM | POA: Diagnosis not present

## 2023-06-02 ENCOUNTER — Encounter: Payer: Self-pay | Admitting: Orthopedic Surgery

## 2023-06-02 NOTE — Progress Notes (Signed)
 Office Visit Note   Patient: Tyler Proctor           Date of Birth: [birth year]           MRN: [id number] Visit Date: 06/01/2023 Requested by: [facility] [address] [place],  [place] [address] PCP: [facility]  Subjective: Chief Complaint  Patient presents with   Other     Bilateral knee pain    HPI: Tyler Proctor is a 28 y.o. male who presents to the office reporting bilateral knee pain left worse than right.  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  He was doing a lot of running and marching and jumping.  He is service-connected with his knees.  He has been out of the service for 2 years.  He states that he had MRIs done 3 years ago and he was told that he has bilateral meniscal tears.  Does have a history of injections and was scheduled to get cortisone at the [facility] later this month.  Does report grinding when he goes up and down the stairs..                ROS: All systems reviewed are negative as they relate to the chief complaint within the history of present illness.  Patient denies fevers or chills.  Assessment & Plan: Visit Diagnoses:  1. Pain in both knees, unspecified chronicity     Plan: Impression is long history of bilateral knee pain left worse than right.  Radiographs look fairly unremarkable in terms of arthritic pathology.  No loose bodies.  Knees are stable.  I think this could represent meniscal pathology which has progressed.  Bilateral MRI scans indicated based on duration of symptoms as well as history of meniscal tears being treated with cortisone injections in this young patient.  Follow-up after those studies.  Follow-Up Instructions: No follow-ups on file.   Orders:  Orders Placed This Encounter  Procedures   XR Knee 1-2 Views Right   XR KNEE 3 VIEW LEFT   MR Knee Right w/o contrast   MR Knee Left w/o contrast   No orders of the defined types were placed in this  encounter.     Procedures: No procedures performed   Clinical Data: No additional findings.  Objective: Vital Signs: There were no vitals taken for this visit.  Physical Exam:  Constitutional: Patient appears well-developed HEENT:  Head: Normocephalic Eyes:EOM are normal Neck: Normal range of motion Cardiovascular: Normal rate Pulmonary/chest: Effort normal Neurologic: Patient is alert Skin: Skin is warm Psychiatric: Patient has normal mood and affect  Ortho Exam: Ortho exam demonstrates full active and passive range of motion of both knees with no groin pain with internal/external Tatian of the leg.  No masses lymphadenopathy or skin changes noted in the knee region.  Collateral cruciate ligaments are stable bilaterally.  Fairly minimal patellofemoral crepitus with passive and active range of motion.  Negative patellar apprehension.  Does have medial greater than lateral joint line tenderness in both knees.  McMurray compression testing is negative.  Specialty Comments:  No specialty comments available.  Imaging: No results found.   PMFS History: Patient Active Problem List   Diagnosis Date Noted   Chest pain 11/18/2016   Cellulitis, umbilical 10/20/2015   Abscess, umbilical 10/20/2015   Hemoptysis 04/26/2015   BMI (body mass index), pediatric, greater than or equal to 95% for age 32/21/2015   Past Medical History:  Diagnosis Date   Asthma    resolved on it's own   Umbilical granuloma     Family History  Problem Relation Age of Onset   Healthy Mother    Healthy Father    Obesity Sister    Diabetes Paternal Grandmother    Hypertension Paternal Grandmother     Past Surgical History:  Procedure Laterality Date   EXCISION UMBILICAL NODULE N/A 02/20/2016   Procedure: EXCISION OF UMBILICAL GRANULOMA;  Surgeon: Vicenta Poli, MD;  Location: [facility];  Service: General;  Laterality: N/A;   LAPAROSCOPIC APPENDECTOMY N/A 07/31/2013   Procedure:  APPENDECTOMY LAPAROSCOPIC;  Surgeon: CHRISTELLA. Julietta Millman, MD;  Location: [facility];  Service: Pediatrics;  Laterality: N/A;   Social History   Occupational History   Not on file  Tobacco Use   Smoking status: Never   Smokeless tobacco: Never  Substance and Sexual Activity   Alcohol use: No   Drug use: No   Sexual activity: Never

## 2023-06-22 ENCOUNTER — Ambulatory Visit: Payer: No Typology Code available for payment source | Admitting: Orthopedic Surgery

## 2023-06-24 ENCOUNTER — Inpatient Hospital Stay: Admission: RE | Admit: 2023-06-24 | Payer: No Typology Code available for payment source | Source: Ambulatory Visit

## 2023-06-24 ENCOUNTER — Other Ambulatory Visit: Payer: No Typology Code available for payment source

## 2024-05-03 ENCOUNTER — Telehealth: Payer: Self-pay | Admitting: Diagnostic Neuroimaging

## 2024-05-03 NOTE — Telephone Encounter (Signed)
 Received sleep referral from [facility] for OSA. Placed in sleep referrals box

## 2024-05-31 NOTE — Telephone Encounter (Signed)
 Received another sleep referral from [facility] for sleep OSA, placed in sleep referrals box

## 2024-06-02 NOTE — Telephone Encounter (Signed)
 Have LVM for patient to schedule sleep consult

## 2024-06-02 NOTE — Telephone Encounter (Addendum)
 [facility] [address] [place]) calling to verify patient has been scheduled for sleep referral Can contact at [phone]

## 2024-06-14 NOTE — Telephone Encounter (Signed)
 Pt called and scheduled sleep consult with Dr. Buck for 07/11/24 at 2:15pm

## 2024-07-11 ENCOUNTER — Institutional Professional Consult (permissible substitution): Payer: Self-pay | Admitting: Neurology
